# Patient Record
Sex: Male | Born: 1980 | Race: White | Hispanic: No | Marital: Single | ZIP: K8N | Smoking: Current every day smoker
Health system: Southern US, Community
[De-identification: ages and names within clinical notes are randomized; demographics above are authoritative.]

---

## 2014-06-12 ENCOUNTER — Emergency Department (INDEPENDENT_AMBULATORY_CARE_PROVIDER_SITE_OTHER): Payer: Worker's Compensation

## 2014-06-12 ENCOUNTER — Emergency Department (INDEPENDENT_AMBULATORY_CARE_PROVIDER_SITE_OTHER)
Admission: EM | Admit: 2014-06-12 | Discharge: 2014-06-12 | Disposition: A | Discharge status: Home / Self Care | Payer: Worker's Compensation | Source: Home / Self Care | Attending: Family Medicine | Admitting: Family Medicine

## 2014-06-12 ENCOUNTER — Encounter (HOSPITAL_COMMUNITY): Payer: Self-pay | Admitting: Emergency Medicine

## 2014-06-12 DIAGNOSIS — T148 Other injury of unspecified body region: Secondary | ICD-10-CM | POA: Diagnosis not present

## 2014-06-12 DIAGNOSIS — IMO0002 Reserved for concepts with insufficient information to code with codable children: Secondary | ICD-10-CM

## 2014-06-12 DIAGNOSIS — Z23 Encounter for immunization: Secondary | ICD-10-CM

## 2014-06-12 MED ORDER — TETANUS-DIPHTH-ACELL PERTUSSIS 5-2.5-18.5 LF-MCG/0.5 IM SUSP
0.5000 mL | Freq: Once | INTRAMUSCULAR | Status: AC
  Administered 2014-06-12: 0.5 mL via INTRAMUSCULAR

## 2014-06-12 MED ORDER — CEPHALEXIN 500 MG PO CAPS: 500.0000 mg | ORAL_CAPSULE | Freq: Two times a day (BID) | ORAL | Status: AC

## 2014-06-12 MED ORDER — TETANUS-DIPHTH-ACELL PERTUSSIS 5-2.5-18.5 LF-MCG/0.5 IM SUSP
INTRAMUSCULAR | Status: AC
  Filled 2014-06-12: qty 0.5

## 2014-06-12 MED ORDER — LIDOCAINE HCL (PF) 2 % IJ SOLN
INTRAMUSCULAR | Status: AC
  Filled 2014-06-12: qty 2

## 2014-06-12 NOTE — ED Provider Notes (Signed)
CSN: [id number]     Arrival date & time 06/12/14  1524 History   First MD Initiated Contact with Patient 06/12/14 1551     Chief Complaint  Patient presents with  . Laceration   (Consider location/radiation/quality/duration/timing/severity/associated sxs/prior Treatment) HPI  Metal tool stabbed pt while trying to pry open compartment on trailer at work today. Occurred around 830 am this morning. Bleeding stopped w/ pressure. Initially w/o any deficits but now feeling stiff w/ thumb flexion. L wrist. Some numbness just distal to the wound.  Unsure of last tetanus.  History reviewed. No pertinent past medical history. History reviewed. No pertinent past surgical history. History reviewed. No pertinent family history. History  Substance Use Topics  . Smoking status: Current Every Day Smoker -- 0.50 packs/day    Types: Cigarettes  . Smokeless tobacco: Not on file  . Alcohol Use: Yes    Review of Systems Per HPI with all other pertinent systems negative.   Allergies  Review of patient's allergies indicates no known allergies.  Home Medications   Prior to Admission medications   Medication Sig Start Date End Date Taking? Authorizing Provider  cephALEXin (KEFLEX) 500 MG capsule Take 1 capsule (500 mg total) by mouth 2 (two) times daily. 06/12/14   Ozella Rocks, MD   BP 138/84  Pulse 97  Temp(Src) 98.9 F (37.2 C) (Oral)  Resp 12  SpO2 97% Physical Exam  Constitutional: He is oriented to person, place, and time. He appears well-developed and well-nourished. No distress.  HENT:  Head: Normocephalic and atraumatic.  Eyes: EOM are normal. Pupils are equal, round, and reactive to light.  Neck: Normal range of motion.  Cardiovascular: Normal rate.   Pulmonary/Chest: Effort normal. No respiratory distress.  Abdominal: Soft. He exhibits no distension.  Musculoskeletal: Normal range of motion.  Neurological: He is alert and oriented to person, place, and time.  Skin: He is not  diaphoretic.  L proximal radial wrist oblique laceration approximately 1.5cm long  Psychiatric: He has a normal mood and affect. His behavior is normal. Judgment and thought content normal.    ED Course  LACERATION REPAIR Date/Time: 06/12/2014 5:00 PM Performed by: Konrad Dolores, DAVID J Authorized by: Konrad Dolores, DAVID J Consent: Verbal consent obtained. Risks and benefits: risks, benefits and alternatives were discussed Consent given by: patient Patient identity confirmed: verbally with patient Body area: upper extremity Location details: left wrist Laceration length: 1.5 cm Contamination: The wound is contaminated. Foreign bodies: no foreign bodies Tendon involvement: none Nerve involvement: none Vascular damage: no Anesthesia: local infiltration Local anesthetic: lidocaine 2% without epinephrine Anesthetic total: 3 ml Irrigation solution: saline Amount of cleaning: standard Debridement: minimal Degree of undermining: none Skin closure: 3-0 Prolene Subcutaneous closure: 4-0 Vicryl Number of sutures: 5 Technique: simple Approximation: close Approximation difficulty: simple Dressing: antibiotic ointment and gauze roll   (including critical care time) Labs Review Labs Reviewed - No data to display  Imaging Review Dg Wrist Complete Left  06/12/2014   CLINICAL DATA:  Left wrist pain after accidentally covering left wrist with a nice this morning, wound lateral and radial aspect, initial evaluation  EXAM: LEFT WRIST - COMPLETE 3+ VIEW  COMPARISON:  None.  FINDINGS: There is no evidence of fracture or dislocation. There is no evidence of arthropathy or other focal bone abnormality. Soft tissues are unremarkable.  IMPRESSION: Negative.   Electronically Signed   By: Esperanza Heir M.D.   On: 06/12/2014 16:27     MDM   1. Laceration    -  Laceration repair as above - tetanus given - start Keflex Sutures remove in 10 days Precautions given and all questions answered  Shelly Flattenavid  Merrell, MD Family Medicine 06/12/2014, 5:03 PM      Ozella Rocksavid J Merrell, MD 06/12/14 [phone]

## 2014-06-12 NOTE — ED Notes (Signed)
Reports laceration to left lower hand thumb side.  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  Last tetanus over 5 yrs.

## 2014-06-12 NOTE — Discharge Instructions (Signed)
Your laceration was repaired with deep and superficial sutures. The 2 deep sutures will dissolve The 3 superficial sutures will need to be removed in 10 days Please take the antibiotics Please apply ointment as needed to keep the area from getting irritated.  Please apply a basic antibiotic ointment from the time you finish the antibiotics until the sutures come out If the wound reopens then try to keep the area clean until it heals over

## 2014-08-15 NOTE — ED Notes (Signed)
After id    Verified  Pt  Was  Advised  That  He  Was  Given   A  tdap     Shot for  His  Info

## 2015-03-19 IMAGING — CR DG WRIST COMPLETE 3+V*L*
4 series · 4 of 4 positions shown · non-contrast
Comparison: None.

CLINICAL DATA: Left wrist pain after accidentally covering left
wrist with a nice this morning, wound lateral and radial aspect,
initial evaluation

EXAM:
LEFT WRIST - COMPLETE 3+ VIEW

[wrist ap (1 of 2)]
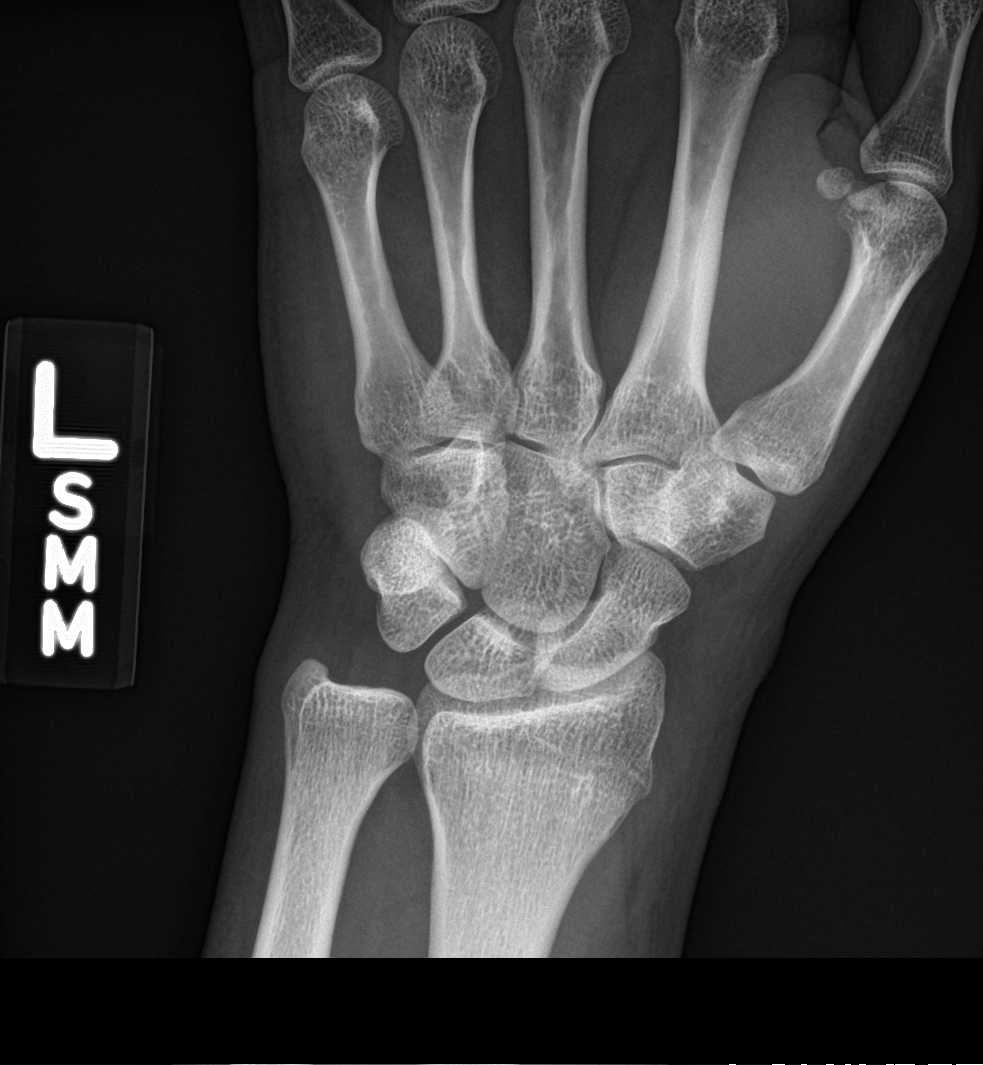

[wrist lat]
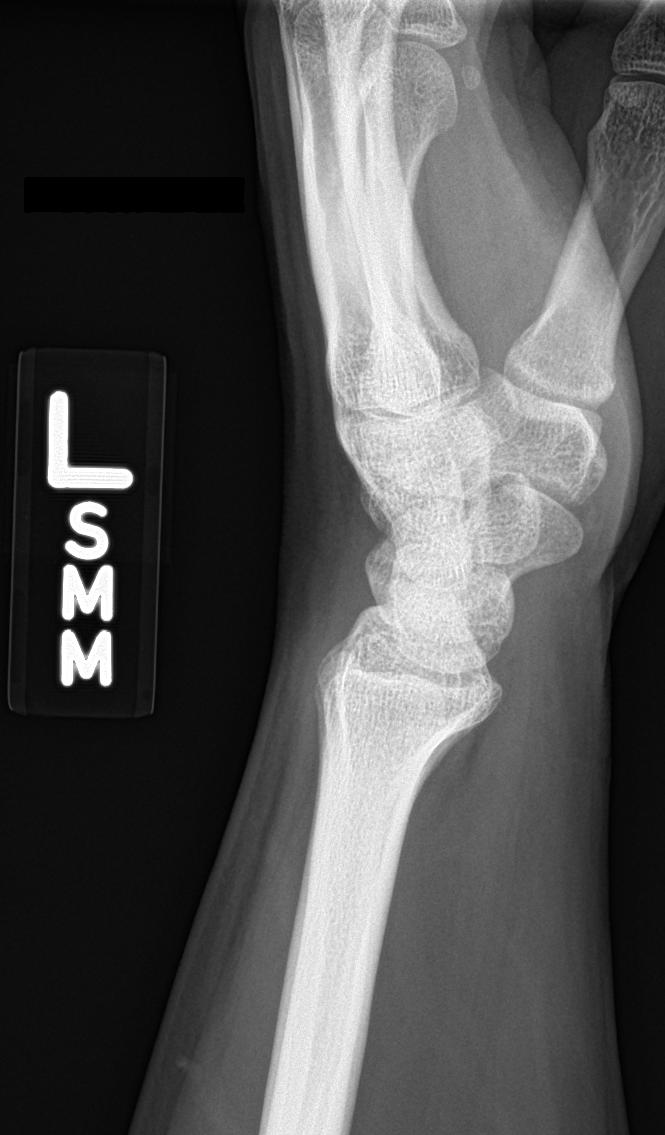

[wrist obl]
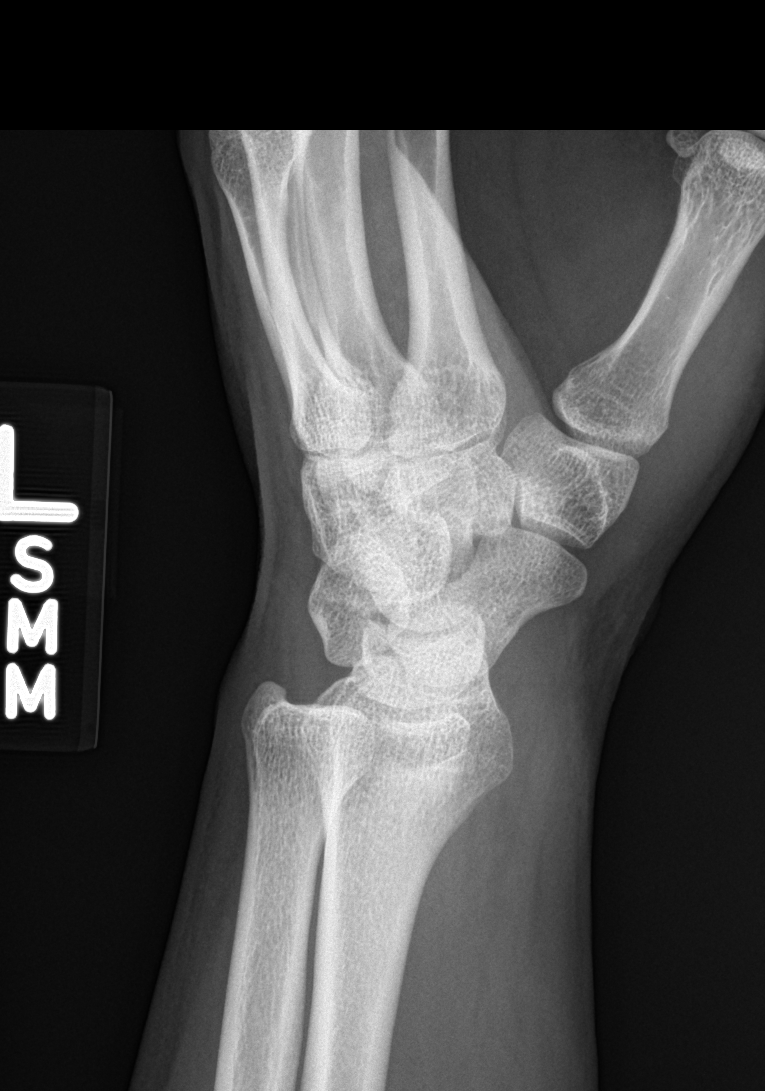

[wrist ap (2 of 2)]
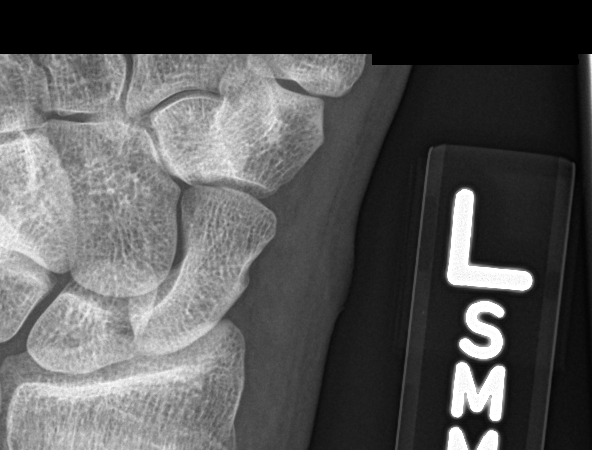

[4 of 4 positions shown; findings below may reference images not displayed]

FINDINGS: There is no evidence of fracture or dislocation. There is no
evidence of arthropathy or other focal bone abnormality. Soft
tissues are unremarkable.
IMPRESSION: Negative.
